# Patient Record
Sex: Female | Born: 1960 | Race: White | Hispanic: No | Marital: Married
Health system: Southern US, Community
[De-identification: ages and names within clinical notes are randomized; demographics above are authoritative.]

---

## 1997-09-02 ENCOUNTER — Other Ambulatory Visit: Admission: RE | Admit: 1997-09-02 | Discharge: 1997-09-02 | Payer: Self-pay | Admitting: Obstetrics and Gynecology

## 1998-02-25 ENCOUNTER — Encounter: Payer: Self-pay | Admitting: *Deleted

## 1998-02-25 ENCOUNTER — Ambulatory Visit (HOSPITAL_COMMUNITY): Admission: RE | Admit: 1998-02-25 | Discharge: 1998-02-25 | Payer: Self-pay | Admitting: *Deleted

## 1998-08-27 ENCOUNTER — Ambulatory Visit (HOSPITAL_COMMUNITY): Admission: RE | Admit: 1998-08-27 | Discharge: 1998-08-27 | Payer: Self-pay | Admitting: *Deleted

## 1998-08-27 ENCOUNTER — Encounter: Payer: Self-pay | Admitting: *Deleted

## 1999-02-09 ENCOUNTER — Ambulatory Visit (HOSPITAL_COMMUNITY): Admission: RE | Admit: 1999-02-09 | Discharge: 1999-02-09 | Payer: Self-pay | Admitting: Obstetrics and Gynecology

## 1999-02-09 ENCOUNTER — Encounter (INDEPENDENT_AMBULATORY_CARE_PROVIDER_SITE_OTHER): Payer: Self-pay | Admitting: Specialist

## 1999-03-25 ENCOUNTER — Ambulatory Visit (HOSPITAL_COMMUNITY): Admission: RE | Admit: 1999-03-25 | Discharge: 1999-03-25 | Payer: Self-pay | Admitting: Obstetrics and Gynecology

## 1999-03-25 ENCOUNTER — Encounter: Payer: Self-pay | Admitting: Obstetrics and Gynecology

## 2000-01-25 ENCOUNTER — Encounter: Payer: Self-pay | Admitting: *Deleted

## 2000-01-25 ENCOUNTER — Encounter: Admission: RE | Admit: 2000-01-25 | Discharge: 2000-01-25 | Payer: Self-pay | Admitting: *Deleted

## 2000-07-25 ENCOUNTER — Encounter: Payer: Self-pay | Admitting: *Deleted

## 2000-07-25 ENCOUNTER — Encounter: Admission: RE | Admit: 2000-07-25 | Discharge: 2000-07-25 | Payer: Self-pay | Admitting: *Deleted

## 2001-01-24 ENCOUNTER — Encounter: Admission: RE | Admit: 2001-01-24 | Discharge: 2001-01-24 | Payer: Self-pay | Admitting: Obstetrics and Gynecology

## 2001-01-24 ENCOUNTER — Encounter: Payer: Self-pay | Admitting: Obstetrics and Gynecology

## 2002-02-22 ENCOUNTER — Encounter: Admission: RE | Admit: 2002-02-22 | Discharge: 2002-02-22 | Payer: Self-pay | Admitting: Obstetrics and Gynecology

## 2002-02-22 ENCOUNTER — Encounter: Payer: Self-pay | Admitting: Obstetrics and Gynecology

## 2002-02-22 ENCOUNTER — Other Ambulatory Visit: Admission: RE | Admit: 2002-02-22 | Discharge: 2002-02-22 | Payer: Self-pay | Admitting: Obstetrics and Gynecology

## 2003-03-01 ENCOUNTER — Encounter: Admission: RE | Admit: 2003-03-01 | Discharge: 2003-03-01 | Payer: Self-pay | Admitting: Obstetrics and Gynecology

## 2003-03-04 ENCOUNTER — Other Ambulatory Visit: Admission: RE | Admit: 2003-03-04 | Discharge: 2003-03-04 | Payer: Self-pay | Admitting: Obstetrics and Gynecology

## 2004-03-13 ENCOUNTER — Encounter: Admission: RE | Admit: 2004-03-13 | Discharge: 2004-03-13 | Payer: Self-pay | Admitting: Obstetrics and Gynecology

## 2005-03-17 ENCOUNTER — Encounter: Admission: RE | Admit: 2005-03-17 | Discharge: 2005-03-17 | Payer: Self-pay | Admitting: Obstetrics and Gynecology

## 2006-03-30 ENCOUNTER — Encounter: Admission: RE | Admit: 2006-03-30 | Discharge: 2006-03-30 | Payer: Self-pay | Admitting: Obstetrics & Gynecology

## 2007-04-05 ENCOUNTER — Encounter: Admission: RE | Admit: 2007-04-05 | Discharge: 2007-04-05 | Payer: Self-pay | Admitting: Obstetrics & Gynecology

## 2007-05-09 ENCOUNTER — Ambulatory Visit: Payer: Self-pay | Admitting: Gastroenterology

## 2008-04-12 ENCOUNTER — Encounter: Admission: RE | Admit: 2008-04-12 | Discharge: 2008-04-12 | Payer: Self-pay | Admitting: Family Medicine

## 2008-10-29 ENCOUNTER — Ambulatory Visit: Payer: Self-pay | Admitting: Gastroenterology

## 2008-11-14 ENCOUNTER — Ambulatory Visit: Payer: Self-pay | Admitting: Gastroenterology

## 2008-12-13 ENCOUNTER — Telehealth: Payer: Self-pay | Admitting: Gastroenterology

## 2008-12-13 ENCOUNTER — Encounter (INDEPENDENT_AMBULATORY_CARE_PROVIDER_SITE_OTHER): Payer: Self-pay

## 2009-04-14 ENCOUNTER — Encounter: Admission: RE | Admit: 2009-04-14 | Discharge: 2009-04-14 | Payer: Self-pay | Admitting: Family Medicine

## 2010-04-03 ENCOUNTER — Other Ambulatory Visit: Payer: Self-pay | Admitting: Family Medicine

## 2010-04-03 DIAGNOSIS — Z1231 Encounter for screening mammogram for malignant neoplasm of breast: Secondary | ICD-10-CM

## 2010-04-21 ENCOUNTER — Ambulatory Visit
Admission: RE | Admit: 2010-04-21 | Discharge: 2010-04-21 | Disposition: A | Discharge status: Home / Self Care | Payer: Commercial Indemnity | Source: Ambulatory Visit | Attending: Family Medicine | Admitting: Family Medicine

## 2010-04-21 DIAGNOSIS — Z1231 Encounter for screening mammogram for malignant neoplasm of breast: Secondary | ICD-10-CM

## 2010-05-22 NOTE — Op Note (Signed)
[facility]  Patient:    Maria Moreno                       MRN: [id number] Proc. Date: 02/09/99 Adm. Date:  [id number] Attending:  Silverio Lay A                           Operative Report  PREOPERATIVE DIAGNOSIS:       Endometrial polyps.  POSTOPERATIVE DIAGNOSIS:      Endometrial polyps.  OPERATION:                    Hysteroscopy and D&C.  SURGEON:                      Silverio Lay, M.D.  ASSISTANT:  ANESTHESIA:                   MAC and paracervical block.  ESTIMATED BLOOD LOSS:         Minimal.  DESCRIPTION OF PROCEDURE:     After being informed of the possible complications and of the procedure, complications including bleeding, infection, and perforation of the uterus possibly needing laparoscopy and laparotomy, informed consent was  obtained and the patient was taken to OR#3.  She was given MAC sedation, placed in the lithotomy position, and prepped and draped in a sterile fashion.  GYN examination revealed an anteverted uterus, small, and both adnexa were normal. A weighted speculum was inserted and the anterior lip of the cervix was grasped with a tenaculum and paracervical block using Nesocaine 1% was done in the usual fashion with 10 cc.  The uterus was sounded at 6 cm and cervix was dilated at dilator #30. Hysteroscope was then inserted under direct visualization and at a pressure of 5 mmHg the uterine cavity was distended with Manitol solution.  Visualization of he entire cavity was very difficult.  The endometrium appeared thickened being day #14 of the cycle and the appearance was compatible with multiple small polyps.  The  hysteroscope was removed and curettage of the four uterine walls were done with  removal of at least three polyps.  Instruments were then removed.  There was no  active bleeding.  The procedure was well tolerated by the patient who was taken to the recovery room in well and stable  condition.  Sponge, needle, and instrument  counts were correct x 2.  Estimated blood loss is minimal. DD:  02/09/99 TD:  02/09/99 Job: [id number]

## 2011-03-25 ENCOUNTER — Other Ambulatory Visit: Payer: Self-pay | Admitting: Family Medicine

## 2011-03-25 DIAGNOSIS — Z1231 Encounter for screening mammogram for malignant neoplasm of breast: Secondary | ICD-10-CM

## 2011-04-22 ENCOUNTER — Ambulatory Visit
Admission: RE | Admit: 2011-04-22 | Discharge: 2011-04-22 | Disposition: A | Discharge status: Home / Self Care | Payer: Commercial Indemnity | Source: Ambulatory Visit | Attending: Family Medicine | Admitting: Family Medicine

## 2011-04-22 DIAGNOSIS — Z1231 Encounter for screening mammogram for malignant neoplasm of breast: Secondary | ICD-10-CM

## 2012-04-12 ENCOUNTER — Other Ambulatory Visit: Payer: Self-pay

## 2012-04-12 DIAGNOSIS — Z1231 Encounter for screening mammogram for malignant neoplasm of breast: Secondary | ICD-10-CM

## 2012-05-08 ENCOUNTER — Ambulatory Visit
Admission: RE | Admit: 2012-05-08 | Discharge: 2012-05-08 | Disposition: A | Discharge status: Home / Self Care | Payer: Managed Care, Other (non HMO) | Source: Ambulatory Visit

## 2012-05-08 DIAGNOSIS — Z1231 Encounter for screening mammogram for malignant neoplasm of breast: Secondary | ICD-10-CM

## 2013-04-24 ENCOUNTER — Other Ambulatory Visit: Payer: Self-pay

## 2013-04-24 DIAGNOSIS — Z1231 Encounter for screening mammogram for malignant neoplasm of breast: Secondary | ICD-10-CM

## 2013-05-14 ENCOUNTER — Encounter (INDEPENDENT_AMBULATORY_CARE_PROVIDER_SITE_OTHER): Payer: Self-pay

## 2013-05-14 ENCOUNTER — Ambulatory Visit
Admission: RE | Admit: 2013-05-14 | Discharge: 2013-05-14 | Disposition: A | Discharge status: Home / Self Care | Payer: Managed Care, Other (non HMO) | Source: Ambulatory Visit

## 2013-05-14 DIAGNOSIS — Z1231 Encounter for screening mammogram for malignant neoplasm of breast: Secondary | ICD-10-CM

## 2013-08-14 ENCOUNTER — Encounter: Payer: Self-pay | Admitting: Gastroenterology

## 2013-09-12 ENCOUNTER — Encounter: Payer: Self-pay | Admitting: Gastroenterology

## 2014-04-08 ENCOUNTER — Encounter: Payer: Self-pay | Admitting: Gastroenterology

## 2014-05-06 ENCOUNTER — Telehealth: Payer: Self-pay | Admitting: Gastroenterology

## 2014-05-06 NOTE — Telephone Encounter (Signed)
Wanted us to Remove her from our Recall Colon Reminder List

## 2014-05-08 ENCOUNTER — Other Ambulatory Visit: Payer: Self-pay

## 2014-05-08 DIAGNOSIS — Z1231 Encounter for screening mammogram for malignant neoplasm of breast: Secondary | ICD-10-CM

## 2014-05-23 ENCOUNTER — Ambulatory Visit
Admission: RE | Admit: 2014-05-23 | Discharge: 2014-05-23 | Disposition: A | Discharge status: Home / Self Care | Payer: Managed Care, Other (non HMO) | Source: Ambulatory Visit

## 2014-05-23 ENCOUNTER — Other Ambulatory Visit: Payer: Self-pay

## 2014-05-23 DIAGNOSIS — Z1231 Encounter for screening mammogram for malignant neoplasm of breast: Secondary | ICD-10-CM

## 2015-05-09 ENCOUNTER — Other Ambulatory Visit: Payer: Self-pay

## 2015-05-09 DIAGNOSIS — Z1231 Encounter for screening mammogram for malignant neoplasm of breast: Secondary | ICD-10-CM

## 2015-05-27 ENCOUNTER — Ambulatory Visit: Payer: Managed Care, Other (non HMO)

## 2015-05-28 ENCOUNTER — Ambulatory Visit
Admission: RE | Admit: 2015-05-28 | Discharge: 2015-05-28 | Disposition: A | Discharge status: Home / Self Care | Payer: Managed Care, Other (non HMO) | Source: Ambulatory Visit

## 2015-05-28 DIAGNOSIS — Z1231 Encounter for screening mammogram for malignant neoplasm of breast: Secondary | ICD-10-CM

## 2015-05-29 ENCOUNTER — Ambulatory Visit: Payer: Managed Care, Other (non HMO)

## 2016-05-07 ENCOUNTER — Other Ambulatory Visit: Payer: Self-pay | Admitting: Family Medicine

## 2016-05-07 DIAGNOSIS — Z1231 Encounter for screening mammogram for malignant neoplasm of breast: Secondary | ICD-10-CM

## 2016-06-03 ENCOUNTER — Ambulatory Visit
Admission: RE | Admit: 2016-06-03 | Discharge: 2016-06-03 | Disposition: A | Discharge status: Home / Self Care | Payer: Managed Care, Other (non HMO) | Source: Ambulatory Visit | Attending: Family Medicine | Admitting: Family Medicine

## 2016-06-03 DIAGNOSIS — Z1231 Encounter for screening mammogram for malignant neoplasm of breast: Secondary | ICD-10-CM

## 2017-05-17 ENCOUNTER — Other Ambulatory Visit: Payer: Self-pay | Admitting: Family Medicine

## 2017-05-17 DIAGNOSIS — Z1231 Encounter for screening mammogram for malignant neoplasm of breast: Secondary | ICD-10-CM

## 2017-06-07 ENCOUNTER — Ambulatory Visit
Admission: RE | Admit: 2017-06-07 | Discharge: 2017-06-07 | Disposition: A | Discharge status: Home / Self Care | Payer: Managed Care, Other (non HMO) | Source: Ambulatory Visit | Attending: Family Medicine | Admitting: Family Medicine

## 2017-06-07 DIAGNOSIS — Z1231 Encounter for screening mammogram for malignant neoplasm of breast: Secondary | ICD-10-CM

## 2018-05-22 ENCOUNTER — Other Ambulatory Visit: Payer: Self-pay | Admitting: Family Medicine

## 2018-05-22 DIAGNOSIS — Z1231 Encounter for screening mammogram for malignant neoplasm of breast: Secondary | ICD-10-CM

## 2018-06-17 ENCOUNTER — Ambulatory Visit
Admission: RE | Admit: 2018-06-17 | Discharge: 2018-06-17 | Disposition: A | Discharge status: Home / Self Care | Payer: Managed Care, Other (non HMO) | Source: Ambulatory Visit | Attending: Family Medicine | Admitting: Family Medicine

## 2018-06-17 ENCOUNTER — Other Ambulatory Visit: Payer: Self-pay

## 2018-06-17 DIAGNOSIS — Z1231 Encounter for screening mammogram for malignant neoplasm of breast: Secondary | ICD-10-CM

## 2018-06-20 ENCOUNTER — Other Ambulatory Visit: Payer: Self-pay | Admitting: Family Medicine

## 2018-06-20 DIAGNOSIS — R928 Other abnormal and inconclusive findings on diagnostic imaging of breast: Secondary | ICD-10-CM

## 2018-06-22 ENCOUNTER — Ambulatory Visit
Admission: RE | Admit: 2018-06-22 | Discharge: 2018-06-22 | Disposition: A | Discharge status: Home / Self Care | Payer: Managed Care, Other (non HMO) | Source: Ambulatory Visit | Attending: Family Medicine | Admitting: Family Medicine

## 2018-06-22 ENCOUNTER — Other Ambulatory Visit: Payer: Self-pay

## 2018-06-22 DIAGNOSIS — R928 Other abnormal and inconclusive findings on diagnostic imaging of breast: Secondary | ICD-10-CM

## 2019-05-25 ENCOUNTER — Other Ambulatory Visit: Payer: Self-pay | Admitting: Family Medicine

## 2019-05-25 DIAGNOSIS — Z1231 Encounter for screening mammogram for malignant neoplasm of breast: Secondary | ICD-10-CM

## 2019-06-18 ENCOUNTER — Other Ambulatory Visit: Payer: Self-pay

## 2019-06-18 ENCOUNTER — Ambulatory Visit
Admission: RE | Admit: 2019-06-18 | Discharge: 2019-06-18 | Disposition: A | Discharge status: Home / Self Care | Payer: Managed Care, Other (non HMO) | Source: Ambulatory Visit | Attending: Family Medicine | Admitting: Family Medicine

## 2019-06-18 DIAGNOSIS — Z1231 Encounter for screening mammogram for malignant neoplasm of breast: Secondary | ICD-10-CM

## 2020-05-15 ENCOUNTER — Other Ambulatory Visit: Payer: Self-pay | Admitting: Family Medicine

## 2020-05-15 DIAGNOSIS — Z1231 Encounter for screening mammogram for malignant neoplasm of breast: Secondary | ICD-10-CM

## 2020-07-14 ENCOUNTER — Other Ambulatory Visit: Payer: Self-pay

## 2020-07-14 ENCOUNTER — Ambulatory Visit
Admission: RE | Admit: 2020-07-14 | Discharge: 2020-07-14 | Disposition: A | Discharge status: Home / Self Care | Payer: Managed Care, Other (non HMO) | Source: Ambulatory Visit | Attending: Family Medicine | Admitting: Family Medicine

## 2020-07-14 DIAGNOSIS — Z1231 Encounter for screening mammogram for malignant neoplasm of breast: Secondary | ICD-10-CM

## 2021-06-03 ENCOUNTER — Other Ambulatory Visit: Payer: Self-pay | Admitting: Family Medicine

## 2021-06-03 DIAGNOSIS — Z1231 Encounter for screening mammogram for malignant neoplasm of breast: Secondary | ICD-10-CM

## 2021-07-15 ENCOUNTER — Ambulatory Visit
Admission: RE | Admit: 2021-07-15 | Discharge: 2021-07-15 | Disposition: A | Discharge status: Home / Self Care | Payer: Managed Care, Other (non HMO) | Source: Ambulatory Visit | Attending: Family Medicine | Admitting: Family Medicine

## 2021-07-15 DIAGNOSIS — Z1231 Encounter for screening mammogram for malignant neoplasm of breast: Secondary | ICD-10-CM

## 2022-03-15 IMAGING — MG MM DIGITAL SCREENING BILAT W/ TOMO AND CAD
8 series · 8 of 24 positions shown · non-contrast
Comparison: Previous exam(s).

CLINICAL DATA: Screening.

EXAM:
DIGITAL SCREENING BILATERAL MAMMOGRAM WITH TOMOSYNTHESIS AND CAD
TECHNIQUE: Bilateral screening digital craniocaudal and mediolateral oblique
mammograms were obtained. Bilateral screening digital breast
tomosynthesis was performed. The images were evaluated with
computer-aided detection.

[L CC synth-2D]
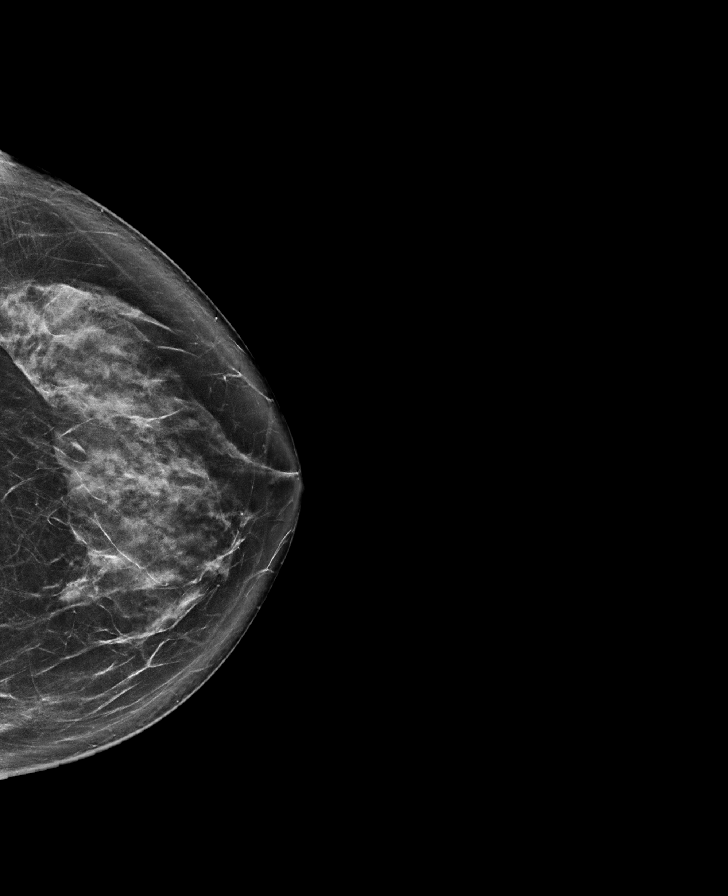

[L MLO synth-2D]
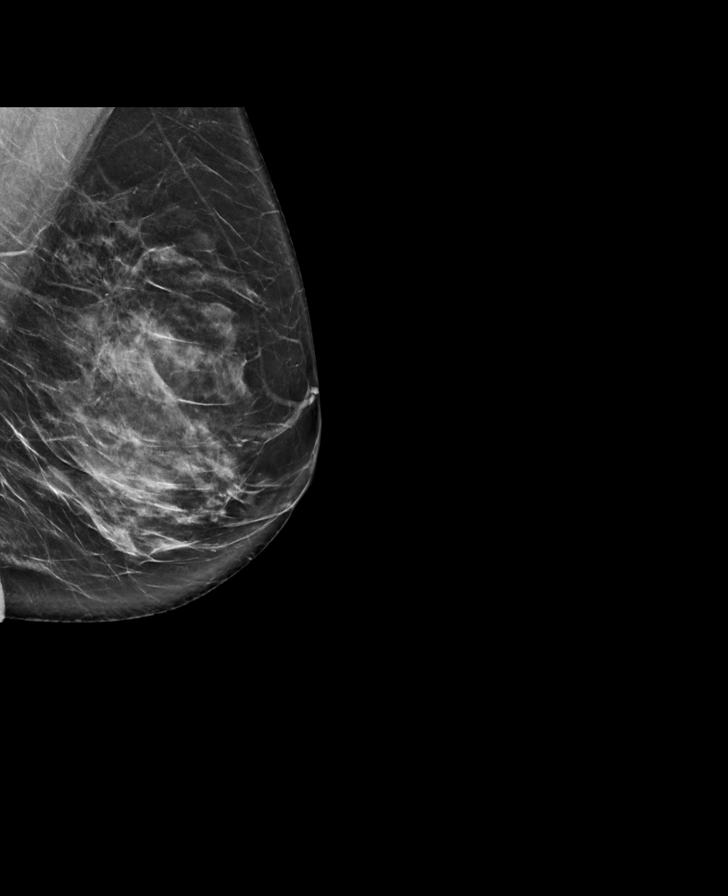

[R MLO synth-2D]
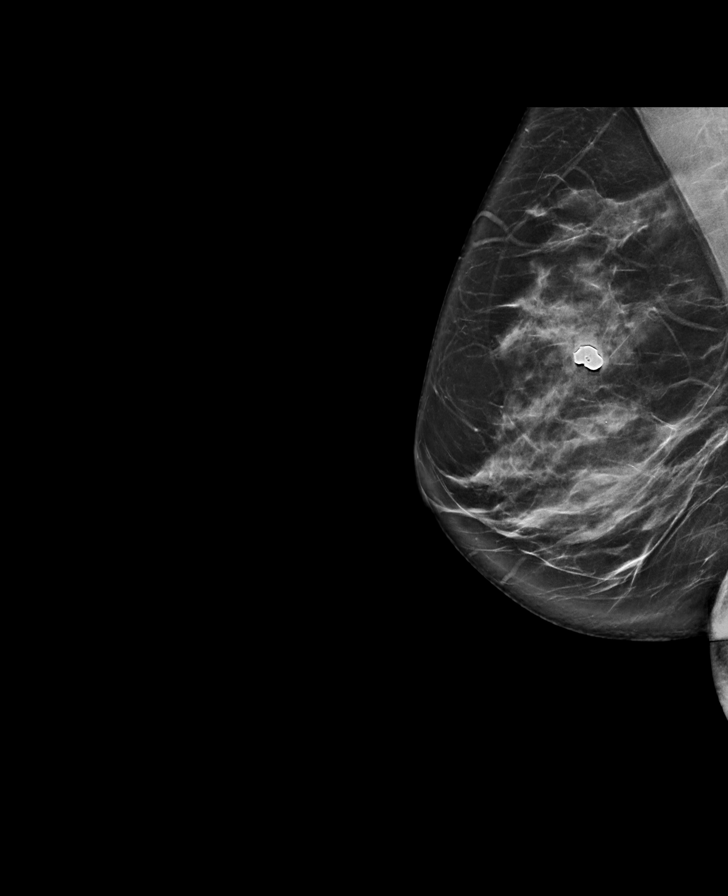

[R CC synth-2D]
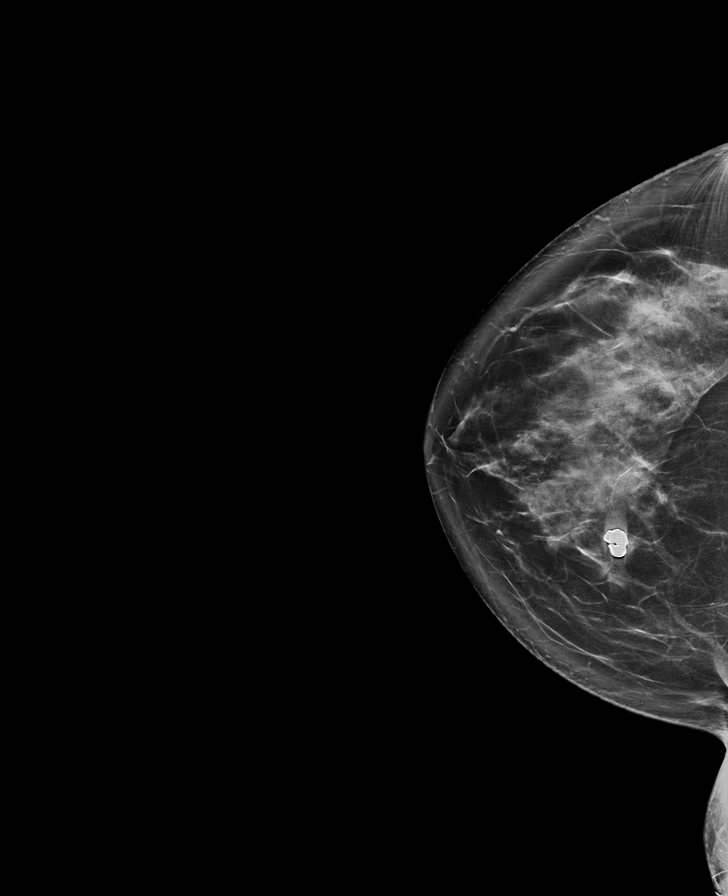

[L CC tomo · tomo slice 43/84.0]
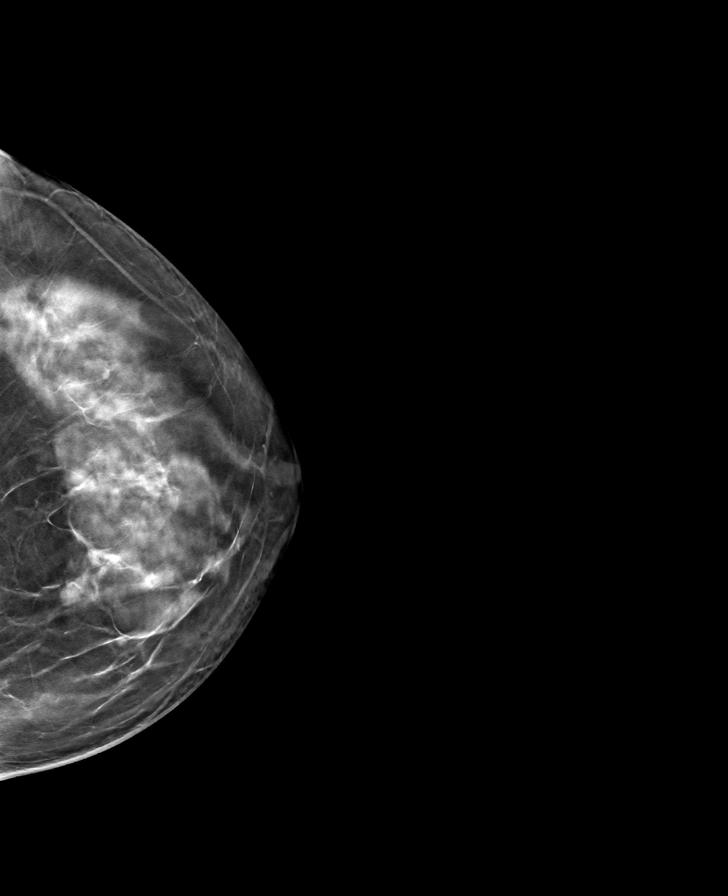

[R CC tomo · tomo slice 43/84.0]
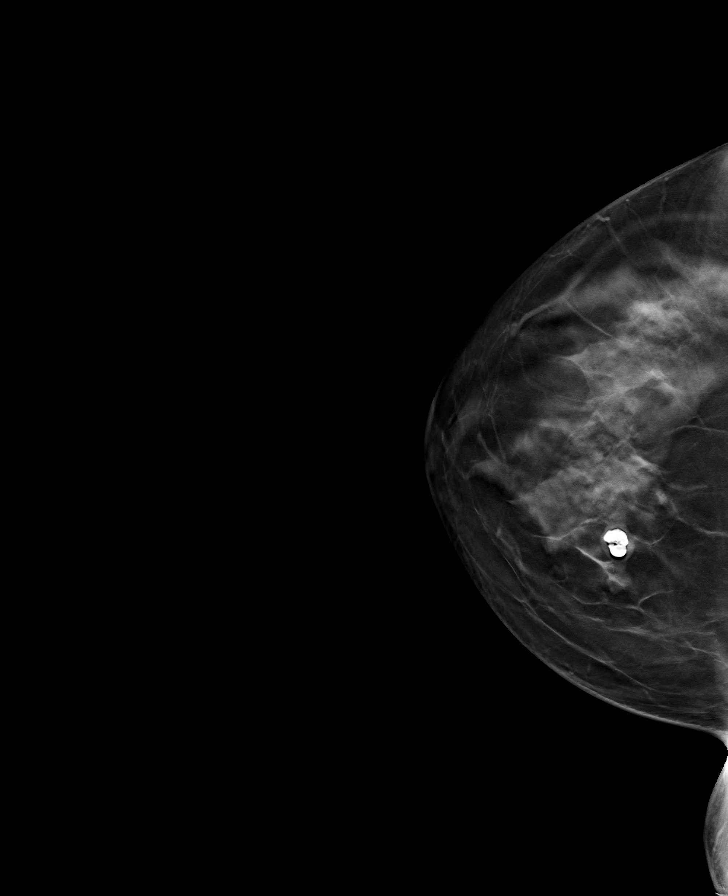

[R MLO tomo · tomo slice 43/86.0]
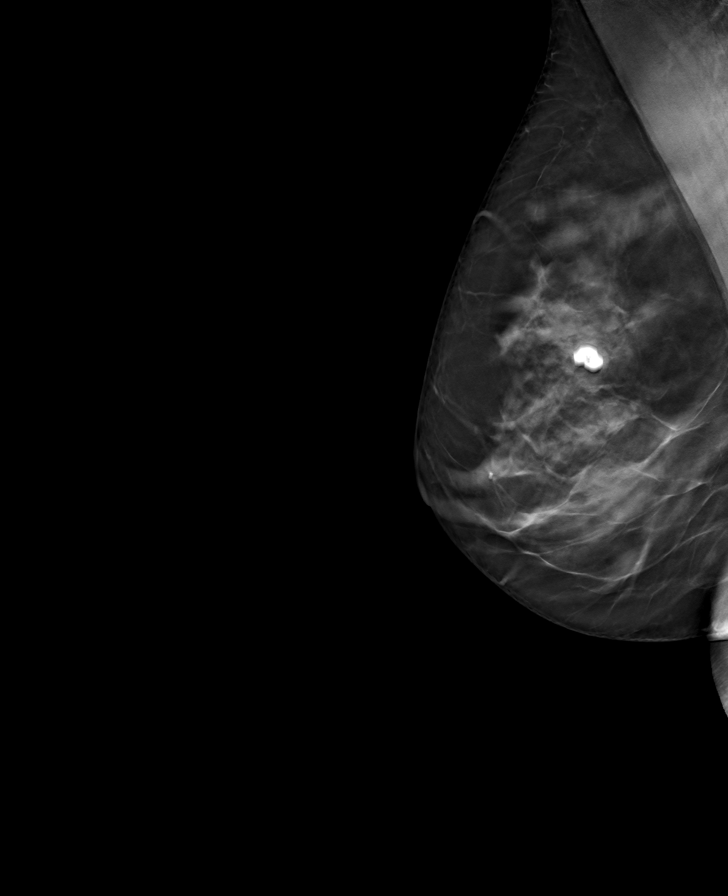

[L MLO tomo · tomo slice 41/82.0]
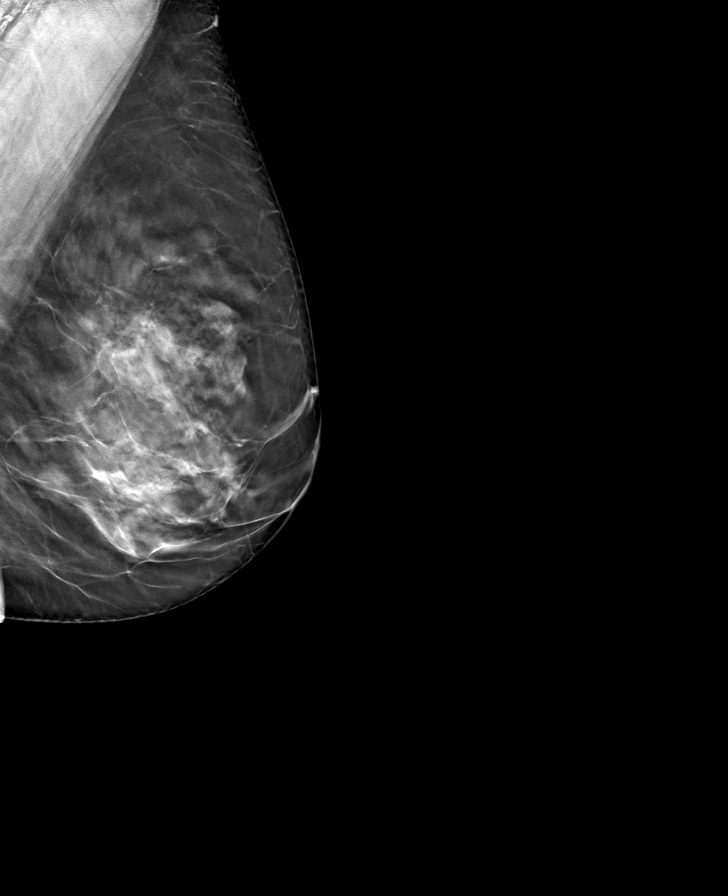

[8 of 24 positions shown; findings below may reference images not displayed]

ACR Breast Density Category c: The breast tissue is heterogeneously
dense, which may obscure small masses.
FINDINGS: There are no findings suspicious for malignancy.
IMPRESSION: No mammographic evidence of malignancy. A result letter of this
screening mammogram will be mailed directly to the patient.

RECOMMENDATION:
Screening mammogram in one year. (Code:Q3-W-BC3)

BI-RADS CATEGORY  1: Negative.

## 2022-06-17 ENCOUNTER — Other Ambulatory Visit: Payer: Self-pay | Admitting: Family Medicine

## 2022-06-17 DIAGNOSIS — Z1231 Encounter for screening mammogram for malignant neoplasm of breast: Secondary | ICD-10-CM

## 2022-07-27 ENCOUNTER — Ambulatory Visit
Admission: RE | Admit: 2022-07-27 | Discharge: 2022-07-27 | Disposition: A | Discharge status: Home / Self Care | Payer: Managed Care, Other (non HMO) | Source: Ambulatory Visit | Attending: Family Medicine | Admitting: Family Medicine

## 2022-07-27 DIAGNOSIS — Z1231 Encounter for screening mammogram for malignant neoplasm of breast: Secondary | ICD-10-CM

## 2023-06-14 ENCOUNTER — Other Ambulatory Visit: Payer: Self-pay | Admitting: Family Medicine

## 2023-06-14 DIAGNOSIS — Z1231 Encounter for screening mammogram for malignant neoplasm of breast: Secondary | ICD-10-CM

## 2023-07-29 ENCOUNTER — Ambulatory Visit
Admission: RE | Admit: 2023-07-29 | Discharge: 2023-07-29 | Disposition: A | Discharge status: Home / Self Care | Source: Ambulatory Visit | Attending: Family Medicine | Admitting: Family Medicine

## 2023-07-29 DIAGNOSIS — Z1231 Encounter for screening mammogram for malignant neoplasm of breast: Secondary | ICD-10-CM

## 2023-08-03 ENCOUNTER — Other Ambulatory Visit: Payer: Self-pay | Admitting: Family Medicine

## 2023-08-03 DIAGNOSIS — R928 Other abnormal and inconclusive findings on diagnostic imaging of breast: Secondary | ICD-10-CM

## 2023-08-10 ENCOUNTER — Ambulatory Visit

## 2023-08-10 ENCOUNTER — Ambulatory Visit
Admission: RE | Admit: 2023-08-10 | Discharge: 2023-08-10 | Disposition: A | Discharge status: Home / Self Care | Source: Ambulatory Visit | Attending: Family Medicine | Admitting: Family Medicine

## 2023-08-10 DIAGNOSIS — R928 Other abnormal and inconclusive findings on diagnostic imaging of breast: Secondary | ICD-10-CM
